# Patient Record
Sex: Male | Born: 1990 | Race: Black or African American | Hispanic: No | Marital: Single | Smoking: Never smoker
Health system: Southern US, Community
[De-identification: ages and names within clinical notes are randomized; demographics above are authoritative.]

## PROBLEM LIST (undated history)

## (undated) DIAGNOSIS — W3400XA Accidental discharge from unspecified firearms or gun, initial encounter: Secondary | ICD-10-CM

## (undated) HISTORY — DX: Accidental discharge from unspecified firearms or gun, initial encounter: W34.00XA

---
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
## 2010-02-19 ENCOUNTER — Emergency Department (HOSPITAL_COMMUNITY): Admission: EM | Admit: 2010-02-19 | Discharge: 2010-02-19 | Payer: Self-pay | Admitting: Emergency Medicine

## 2011-07-04 ENCOUNTER — Emergency Department (HOSPITAL_COMMUNITY): Admission: EM | Admit: 2011-07-04 | Payer: Self-pay | Source: Home / Self Care

## 2011-07-04 ENCOUNTER — Emergency Department (HOSPITAL_COMMUNITY)
Admission: EM | Admit: 2011-07-04 | Discharge: 2011-07-05 | Disposition: A | Discharge status: Home / Self Care | Payer: Self-pay | Attending: Emergency Medicine | Admitting: Emergency Medicine

## 2011-07-04 DIAGNOSIS — K047 Periapical abscess without sinus: Secondary | ICD-10-CM | POA: Insufficient documentation

## 2011-07-04 DIAGNOSIS — K089 Disorder of teeth and supporting structures, unspecified: Secondary | ICD-10-CM | POA: Insufficient documentation

## 2012-06-16 ENCOUNTER — Emergency Department (HOSPITAL_COMMUNITY)
Admission: EM | Admit: 2012-06-16 | Discharge: 2012-06-16 | Disposition: A | Discharge status: Home / Self Care | Payer: Self-pay | Attending: Emergency Medicine | Admitting: Emergency Medicine

## 2012-06-16 ENCOUNTER — Encounter (HOSPITAL_COMMUNITY): Payer: Self-pay | Admitting: Emergency Medicine

## 2012-06-16 DIAGNOSIS — K089 Disorder of teeth and supporting structures, unspecified: Secondary | ICD-10-CM | POA: Insufficient documentation

## 2012-06-16 DIAGNOSIS — R220 Localized swelling, mass and lump, head: Secondary | ICD-10-CM | POA: Insufficient documentation

## 2012-06-16 DIAGNOSIS — R221 Localized swelling, mass and lump, neck: Secondary | ICD-10-CM | POA: Insufficient documentation

## 2012-06-16 DIAGNOSIS — K0889 Other specified disorders of teeth and supporting structures: Secondary | ICD-10-CM

## 2012-06-16 MED ORDER — IBUPROFEN 800 MG PO TABS
800.0000 mg | ORAL_TABLET | Freq: Once | ORAL | Status: AC
  Administered 2012-06-16: 800 mg via ORAL
  Filled 2012-06-16: qty 1

## 2012-06-16 MED ORDER — PENICILLIN V POTASSIUM 500 MG PO TABS: 500.0000 mg | ORAL_TABLET | Freq: Four times a day (QID) | ORAL | Status: AC

## 2012-06-16 MED ORDER — IBUPROFEN 800 MG PO TABS: 800.0000 mg | ORAL_TABLET | Freq: Three times a day (TID) | ORAL | Status: AC

## 2012-06-16 MED ORDER — OXYCODONE-ACETAMINOPHEN 5-325 MG PO TABS
2.0000 | ORAL_TABLET | Freq: Once | ORAL | Status: AC
  Administered 2012-06-16: 2 via ORAL
  Filled 2012-06-16: qty 2

## 2012-06-16 MED ORDER — OXYCODONE-ACETAMINOPHEN 5-325 MG PO TABS: 2.0000 | ORAL_TABLET | ORAL | Status: AC | PRN

## 2012-06-16 NOTE — ED Notes (Signed)
Pt reports L lower tooth pain since last night

## 2012-06-16 NOTE — ED Provider Notes (Signed)
History     CSN: [id number]  Arrival date & time 06/16/12  0049   None     Chief Complaint  Patient presents with  . Dental Pain    (Consider location/radiation/quality/duration/timing/severity/associated sxs/prior treatment) Patient is a 21 y.o. male presenting with tooth pain. The history is provided by the patient. No language interpreter was used.  Dental PainThe primary symptoms include mouth pain. Primary symptoms do not include dental injury, oral bleeding, fever or sore throat. The symptoms began 6 to 12 hours ago. The symptoms are worsening. The symptoms occur constantly.  Additional symptoms include: gum swelling and gum tenderness. Additional symptoms do not include: purulent gums, trismus, jaw pain, facial swelling, trouble swallowing and ear pain. Medical issues do not include: smoking and periodontal disease.  21 yo with L lower incisor pain x 12 hours.   History reviewed. No pertinent past medical history.  History reviewed. No pertinent past surgical history.  History reviewed. No pertinent family history.  History  Substance Use Topics  . Smoking status: Never Smoker   . Smokeless tobacco: Not on file  . Alcohol Use: No      Review of Systems  Constitutional: Negative.  Negative for fever.  HENT: Positive for dental problem. Negative for ear pain, sore throat, facial swelling and trouble swallowing.   Eyes: Negative.   Respiratory: Negative.   Cardiovascular: Negative.   Gastrointestinal: Negative.   Neurological: Negative.   Psychiatric/Behavioral: Negative.   All other systems reviewed and are negative.    Allergies  Review of patient's allergies indicates no known allergies.  Home Medications  No current outpatient prescriptions on file.  BP 153/99  Pulse 106  Temp 98.3 F (36.8 C) (Oral)  Resp 16  SpO2 99%  Physical Exam  Nursing note and vitals reviewed. Constitutional: He is oriented to person, place, and time. He appears  well-developed and well-nourished.  HENT:  Head: Normocephalic.  Right Ear: Tympanic membrane normal.  Left Ear: Tympanic membrane normal.  Nose: Nose normal. Right sinus exhibits no maxillary sinus tenderness and no frontal sinus tenderness. Left sinus exhibits no maxillary sinus tenderness and no frontal sinus tenderness.  Mouth/Throat: Uvula is midline and oropharynx is clear and moist. No uvula swelling or dental caries. No oropharyngeal exudate, posterior oropharyngeal edema, posterior oropharyngeal erythema or tonsillar abscesses.         Gum swelling inferior L lower mouth near incisor.  No cavity visible.    Eyes: Conjunctivae and EOM are normal. Pupils are equal, round, and reactive to light.  Neck: Normal range of motion. Neck supple.  Cardiovascular: Normal rate.   Pulmonary/Chest: Effort normal.  Abdominal: Soft.  Musculoskeletal: Normal range of motion.  Neurological: He is alert and oriented to person, place, and time.  Skin: Skin is warm and dry.  Psychiatric: He has a normal mood and affect.    ED Course  Procedures (including critical care time)  Labs Reviewed - No data to display No results found.   No diagnosis found.    MDM  L lower gum swelling with no visible cavity.  rx for pcn and pain meds.  Will follow up with dentist on call if not better within 48 hours.          Remi Haggard, NP 06/17/12 [phone]

## 2012-06-17 NOTE — ED Provider Notes (Signed)
Medical screening examination/treatment/procedure(s) were performed by non-physician practitioner and as supervising physician I was immediately available for consultation/collaboration.  Byanka Landrus, MD 06/17/12 1657 

## 2013-04-06 ENCOUNTER — Emergency Department (HOSPITAL_COMMUNITY): Payer: Self-pay

## 2013-04-06 ENCOUNTER — Emergency Department (HOSPITAL_COMMUNITY)
Admission: EM | Admit: 2013-04-06 | Discharge: 2013-04-06 | Disposition: A | Discharge status: Home / Self Care | Payer: Self-pay | Attending: Emergency Medicine | Admitting: Emergency Medicine

## 2013-04-06 ENCOUNTER — Encounter (HOSPITAL_COMMUNITY): Payer: Self-pay | Admitting: Emergency Medicine

## 2013-04-06 DIAGNOSIS — S60219A Contusion of unspecified wrist, initial encounter: Secondary | ICD-10-CM | POA: Insufficient documentation

## 2013-04-06 DIAGNOSIS — S31809A Unspecified open wound of unspecified buttock, initial encounter: Secondary | ICD-10-CM | POA: Insufficient documentation

## 2013-04-06 DIAGNOSIS — Z23 Encounter for immunization: Secondary | ICD-10-CM | POA: Insufficient documentation

## 2013-04-06 DIAGNOSIS — S60212A Contusion of left wrist, initial encounter: Secondary | ICD-10-CM

## 2013-04-06 LAB — COMPREHENSIVE METABOLIC PANEL
ALT: 18 U/L (ref 0–53)
AST: 32 U/L (ref 0–37)
Alkaline Phosphatase: 87 U/L (ref 39–117)
CO2: 22 mEq/L (ref 19–32)
Calcium: 9.2 mg/dL (ref 8.4–10.5)
Chloride: 103 mEq/L (ref 96–112)
GFR calc Af Amer: >90 mL/min (ref >90)
GFR calc non Af Amer: >90 mL/min (ref >90)
Glucose, Bld: 105 mg/dL — ABNORMAL HIGH (ref 70–99)
Potassium: 3.6 mEq/L (ref 3.5–5.1)
Sodium: 139 mEq/L (ref 135–145)

## 2013-04-06 LAB — CBC WITH DIFFERENTIAL/PLATELET
Basophils Relative: 0 % (ref 0–1)
Eosinophils Absolute: 0.3 10*3/uL (ref 0.0–0.7)
Eosinophils Relative: 2 % (ref 0–5)
HCT: 44.4 % (ref 39.0–52.0)
Hemoglobin: 15.4 g/dL (ref 13.0–17.0)
Lymphocytes Relative: 36 % (ref 12–46)
MCHC: 34.7 g/dL (ref 30.0–36.0)
Monocytes Relative: 10 % (ref 3–12)
Neutro Abs: 7.8 10*3/uL — ABNORMAL HIGH (ref 1.7–7.7)
Neutrophils Relative %: 52 % (ref 43–77)
RBC: 5.37 MIL/uL (ref 4.22–5.81)
WBC: 15 10*3/uL — ABNORMAL HIGH (ref 4.0–10.5)

## 2013-04-06 MED ORDER — IBUPROFEN 400 MG PO TABS: 400.0000 mg | ORAL_TABLET | Freq: Four times a day (QID) | ORAL | Status: DC | PRN

## 2013-04-06 MED ORDER — FENTANYL CITRATE 0.05 MG/ML IJ SOLN
100.0000 ug | Freq: Once | INTRAMUSCULAR | Status: AC
  Administered 2013-04-06: 100 ug via INTRAVENOUS

## 2013-04-06 MED ORDER — SODIUM CHLORIDE 0.9 % IV BOLUS (SEPSIS): 1000.0000 mL | Freq: Once | INTRAVENOUS | Status: DC

## 2013-04-06 MED ORDER — FENTANYL CITRATE 0.05 MG/ML IJ SOLN
100.0000 ug | Freq: Once | INTRAMUSCULAR | Status: AC
  Administered 2013-04-06: 100 ug via INTRAVENOUS
  Filled 2013-04-06: qty 2

## 2013-04-06 MED ORDER — TETANUS-DIPHTH-ACELL PERTUSSIS 5-2.5-18.5 LF-MCG/0.5 IM SUSP
0.5000 mL | Freq: Once | INTRAMUSCULAR | Status: AC
  Administered 2013-04-06: 0.5 mL via INTRAMUSCULAR
  Filled 2013-04-06: qty 0.5

## 2013-04-06 MED ORDER — OXYCODONE-ACETAMINOPHEN 5-325 MG PO TABS: 2.0000 | ORAL_TABLET | ORAL | Status: DC | PRN

## 2013-04-06 MED ORDER — FENTANYL CITRATE 0.05 MG/ML IJ SOLN
100.0000 ug | Freq: Once | INTRAMUSCULAR | Status: DC
  Filled 2013-04-06: qty 2

## 2013-04-06 MED ORDER — SODIUM CHLORIDE 0.9 % IV BOLUS (SEPSIS)
1000.0000 mL | Freq: Once | INTRAVENOUS | Status: AC
  Administered 2013-04-06: 1000 mL via INTRAVENOUS
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
## 2013-04-07 ENCOUNTER — Telehealth (HOSPITAL_COMMUNITY): Payer: Self-pay | Admitting: Emergency Medicine

## 2013-04-07 NOTE — Telephone Encounter (Signed)
This encounter opened in the wrong chart. Patient's name was Theresa.

## 2013-04-16 ENCOUNTER — Encounter (INDEPENDENT_AMBULATORY_CARE_PROVIDER_SITE_OTHER): Payer: Self-pay

## 2013-04-20 ENCOUNTER — Telehealth (INDEPENDENT_AMBULATORY_CARE_PROVIDER_SITE_OTHER): Payer: Self-pay | Admitting: General Surgery

## 2013-04-20 ENCOUNTER — Telehealth (HOSPITAL_COMMUNITY): Payer: Self-pay | Admitting: Emergency Medicine

## 2013-04-20 NOTE — Telephone Encounter (Signed)
Wrong patient, addressed in correct pt chart
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
## 2013-04-23 ENCOUNTER — Encounter (INDEPENDENT_AMBULATORY_CARE_PROVIDER_SITE_OTHER): Payer: Self-pay

## 2013-04-23 ENCOUNTER — Ambulatory Visit (INDEPENDENT_AMBULATORY_CARE_PROVIDER_SITE_OTHER): Payer: Self-pay | Admitting: Internal Medicine

## 2013-04-23 DIAGNOSIS — S31809A Unspecified open wound of unspecified buttock, initial encounter: Secondary | ICD-10-CM

## 2013-04-23 DIAGNOSIS — Z5189 Encounter for other specified aftercare: Secondary | ICD-10-CM
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
## 2013-04-23 NOTE — Patient Instructions (Signed)
Neosporin and dry dressing over top wound  Change twice daily May shower Follow up next week for wound check Call with questions or concerns.

## 2013-04-30 ENCOUNTER — Telehealth (INDEPENDENT_AMBULATORY_CARE_PROVIDER_SITE_OTHER): Payer: Self-pay | Admitting: General Surgery

## 2013-04-30 ENCOUNTER — Encounter (INDEPENDENT_AMBULATORY_CARE_PROVIDER_SITE_OTHER): Payer: Self-pay

## 2013-04-30 NOTE — Telephone Encounter (Signed)
Called patient for reason of missed appointment. Recording says: "The customer you are trying to reach is unavailable."

## 2013-08-28 ENCOUNTER — Encounter (HOSPITAL_COMMUNITY): Payer: Self-pay | Admitting: Emergency Medicine

## 2013-08-28 ENCOUNTER — Emergency Department (HOSPITAL_COMMUNITY)
Admission: EM | Admit: 2013-08-28 | Discharge: 2013-08-28 | Disposition: A | Discharge status: Home / Self Care | Payer: Self-pay | Attending: Emergency Medicine | Admitting: Emergency Medicine

## 2013-08-28 DIAGNOSIS — Z872 Personal history of diseases of the skin and subcutaneous tissue: Secondary | ICD-10-CM | POA: Insufficient documentation

## 2013-08-28 DIAGNOSIS — R509 Fever, unspecified: Secondary | ICD-10-CM | POA: Insufficient documentation

## 2013-08-28 DIAGNOSIS — L0501 Pilonidal cyst with abscess: Secondary | ICD-10-CM | POA: Insufficient documentation

## 2013-08-28 MED ORDER — LIDOCAINE HCL 2 % IJ SOLN
10.0000 mL | Freq: Once | INTRAMUSCULAR | Status: AC
  Administered 2013-08-28: 200 mg
  Filled 2013-08-28: qty 20

## 2013-08-28 MED ORDER — HYDROCODONE-ACETAMINOPHEN 5-325 MG PO TABS: 1.0000 | ORAL_TABLET | ORAL | Status: AC | PRN

## 2013-08-28 MED ORDER — SULFAMETHOXAZOLE-TRIMETHOPRIM 800-160 MG PO TABS: 1.0000 | ORAL_TABLET | Freq: Two times a day (BID) | ORAL | Status: AC

## 2013-08-28 MED ORDER — HYDROCODONE-ACETAMINOPHEN 5-325 MG PO TABS
1.0000 | ORAL_TABLET | Freq: Once | ORAL | Status: AC
  Administered 2013-08-28: 1 via ORAL
  Filled 2013-08-28: qty 1

## 2013-08-28 NOTE — ED Provider Notes (Signed)
Medical screening examination/treatment/procedure(s) were performed by non-physician practitioner and as supervising physician I was immediately available for consultation/collaboration.   Rolan Bucco, MD 08/28/13 [phone]
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
## 2013-08-30 ENCOUNTER — Emergency Department (HOSPITAL_COMMUNITY)
Admission: EM | Admit: 2013-08-30 | Discharge: 2013-08-30 | Disposition: A | Discharge status: Home / Self Care | Payer: Self-pay | Attending: Emergency Medicine | Admitting: Emergency Medicine

## 2013-08-30 DIAGNOSIS — L0501 Pilonidal cyst with abscess: Secondary | ICD-10-CM | POA: Insufficient documentation

## 2013-08-30 DIAGNOSIS — Z4801 Encounter for change or removal of surgical wound dressing: Secondary | ICD-10-CM | POA: Insufficient documentation
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
## 2013-08-31 NOTE — ED Provider Notes (Signed)
Medical screening examination/treatment/procedure(s) were performed by non-physician practitioner and as supervising physician I was immediately available for consultation/collaboration.  Sherhonda Gaspar F Cyle Kenyon, MD 08/31/13 0851 
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
## 2015-08-30 ENCOUNTER — Encounter (HOSPITAL_COMMUNITY): Payer: Self-pay | Admitting: *Deleted

## 2015-08-30 ENCOUNTER — Emergency Department (HOSPITAL_COMMUNITY): Payer: No Typology Code available for payment source

## 2015-08-30 ENCOUNTER — Emergency Department (HOSPITAL_COMMUNITY)
Admission: EM | Admit: 2015-08-30 | Discharge: 2015-08-30 | Disposition: A | Discharge status: Home / Self Care | Payer: No Typology Code available for payment source | Attending: Emergency Medicine | Admitting: Emergency Medicine

## 2015-08-30 DIAGNOSIS — Y9389 Activity, other specified: Secondary | ICD-10-CM | POA: Insufficient documentation

## 2015-08-30 DIAGNOSIS — Y998 Other external cause status: Secondary | ICD-10-CM | POA: Diagnosis not present

## 2015-08-30 DIAGNOSIS — Y92410 Unspecified street and highway as the place of occurrence of the external cause: Secondary | ICD-10-CM | POA: Insufficient documentation

## 2015-08-30 DIAGNOSIS — S99912A Unspecified injury of left ankle, initial encounter: Secondary | ICD-10-CM | POA: Diagnosis present

## 2015-08-30 DIAGNOSIS — S93402A Sprain of unspecified ligament of left ankle, initial encounter: Secondary | ICD-10-CM | POA: Insufficient documentation

## 2015-08-30 DIAGNOSIS — Z79899 Other long term (current) drug therapy: Secondary | ICD-10-CM | POA: Insufficient documentation

## 2015-08-30 MED ORDER — NAPROXEN 500 MG PO TABS: 500.0000 mg | ORAL_TABLET | Freq: Two times a day (BID) | ORAL | Status: AC
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
## 2015-09-11 ENCOUNTER — Encounter (HOSPITAL_COMMUNITY): Payer: Self-pay | Admitting: Emergency Medicine

## 2015-09-11 ENCOUNTER — Emergency Department (HOSPITAL_COMMUNITY)
Admission: EM | Admit: 2015-09-11 | Discharge: 2015-09-11 | Disposition: A | Discharge status: Home / Self Care | Payer: No Typology Code available for payment source | Attending: Emergency Medicine | Admitting: Emergency Medicine

## 2015-09-11 DIAGNOSIS — Z791 Long term (current) use of non-steroidal anti-inflammatories (NSAID): Secondary | ICD-10-CM | POA: Diagnosis not present

## 2015-09-11 DIAGNOSIS — Y998 Other external cause status: Secondary | ICD-10-CM | POA: Insufficient documentation

## 2015-09-11 DIAGNOSIS — S93402A Sprain of unspecified ligament of left ankle, initial encounter: Secondary | ICD-10-CM

## 2015-09-11 DIAGNOSIS — S99912A Unspecified injury of left ankle, initial encounter: Secondary | ICD-10-CM | POA: Diagnosis present

## 2015-09-11 DIAGNOSIS — Y9389 Activity, other specified: Secondary | ICD-10-CM | POA: Insufficient documentation

## 2015-09-11 DIAGNOSIS — Y92410 Unspecified street and highway as the place of occurrence of the external cause: Secondary | ICD-10-CM | POA: Diagnosis not present

## 2015-09-11 MED ORDER — TRAMADOL HCL 50 MG PO TABS: 50.0000 mg | ORAL_TABLET | Freq: Four times a day (QID) | ORAL | Status: AC | PRN

## 2015-09-11 NOTE — ED Notes (Signed)
Patient was alert, oriented and stable upon discharge. RN went over AVS and patient had no further questions.  

## 2015-09-11 NOTE — Discharge Instructions (Signed)
Ankle Sprain °An ankle sprain is an injury to the strong, fibrous tissues (ligaments) that hold the bones of your ankle joint together.  °CAUSES °An ankle sprain is usually caused by a fall or by twisting your ankle. Ankle sprains most commonly occur when you step on the outer edge of your foot, and your ankle turns inward. People who participate in sports are more prone to these types of injuries.  °SYMPTOMS  °· Pain in your ankle. The pain may be present at rest or only when you are trying to stand or walk. °· Swelling. °· Bruising. Bruising may develop immediately or within 1 to 2 days after your injury. °· Difficulty standing or walking, particularly when turning corners or changing directions. °DIAGNOSIS  °Your caregiver will ask you details about your injury and perform a physical exam of your ankle to determine if you have an ankle sprain. During the physical exam, your caregiver will press on and apply pressure to specific areas of your foot and ankle. Your caregiver will try to move your ankle in certain ways. An X-ray exam may be done to be sure a bone was not broken or a ligament did not separate from one of the bones in your ankle (avulsion fracture).  °TREATMENT  °Certain types of braces can help stabilize your ankle. Your caregiver can make a recommendation for this. Your caregiver may recommend the use of medicine for pain. If your sprain is severe, your caregiver may refer you to a surgeon who helps to restore function to parts of your skeletal system (orthopedist) or a physical therapist. °HOME CARE INSTRUCTIONS  °· Apply ice to your injury for 1-2 days or as directed by your caregiver. Applying ice helps to reduce inflammation and pain. °¨ Put ice in a plastic bag. °¨ Place a towel between your skin and the bag. °¨ Leave the ice on for 15-20 minutes at a time, every 2 hours while you are awake. °· Only take over-the-counter or prescription medicines for pain, discomfort, or fever as directed by  your caregiver. °· Elevate your injured ankle above the level of your heart as much as possible for 2-3 days. °· If your caregiver recommends crutches, use them as instructed. Gradually put weight on the affected ankle. Continue to use crutches or a cane until you can walk without feeling pain in your ankle. °· If you have a plaster splint, wear the splint as directed by your caregiver. Do not rest it on anything harder than a pillow for the first 24 hours. Do not put weight on it. Do not get it wet. You may take it off to take a shower or bath. °· You may have been given an elastic bandage to wear around your ankle to provide support. If the elastic bandage is too tight (you have numbness or tingling in your foot or your foot becomes cold and blue), adjust the bandage to make it comfortable. °· If you have an air splint, you may blow more air into it or let air out to make it more comfortable. You may take your splint off at night and before taking a shower or bath. Wiggle your toes in the splint several times per day to decrease swelling. °SEEK MEDICAL CARE IF:  °· You have rapidly increasing bruising or swelling. °· Your toes feel extremely cold or you lose feeling in your foot. °· Your pain is not relieved with medicine. °SEEK IMMEDIATE MEDICAL CARE IF: °· Your toes are numb or blue. °·   You have severe pain that is increasing. MAKE SURE YOU:   Understand these instructions.  Will watch your condition.  Will get help right away if you are not doing well or get worse.   This information is not intended to replace advice given to you by your health care provider. Make sure you discuss any questions you have with your health care provider.   Document Released: 10/28/2005 Document Revised: 11/18/2014 Document Reviewed: 11/09/2011 Elsevier Interactive Patient Education 2016 [employer]  Emergency Department Resource Guide 1) Find a Doctor and Pay Out of Pocket Although you won't have to find out who  is covered by your insurance plan, it is a good idea to ask around and get recommendations. You will then need to call the office and see if the doctor you have chosen will accept you as a new patient and what types of options they offer for patients who are self-pay. Some doctors offer discounts or will set up payment plans for their patients who do not have insurance, but you will need to ask so you aren't surprised when you get to your appointment.  2) Contact Your Local Health Department Not all health departments have doctors that can see patients for sick visits, but many do, so it is worth a call to see if yours does. If you don't know where your local health department is, you can check in your phone book. The CDC also has a tool to help you locate your state's health department, and many state websites also have listings of all of their local health departments.  3) Find a Walk-in Clinic If your illness is not likely to be very severe or complicated, you may want to try a walk in clinic. These are popping up all over the country in pharmacies, drugstores, and shopping centers. They're usually staffed by nurse practitioners or physician assistants that have been trained to treat common illnesses and complaints. They're usually fairly quick and inexpensive. However, if you have serious medical issues or chronic medical problems, these are probably not your best option.  No Primary Care Doctor: - Call Health Connect at  [phone] - they can help you locate a primary care doctor that  accepts your insurance, provides certain services, etc. - Physician Referral Service- [phone]  Chronic Pain Problems: Organization         Address  Phone   Notes  [facility]  [phone]) [phone] Patients need to be referred by their primary care doctor.   Medication Assistance: Organization         Address  Phone   Notes  [facility] [address]  [address] [place], [place] [address] [phone] --Must be a resident of [place] [facility] -- Must have NO insurance coverage whatsoever (no Medicaid/ Medicare, etc.) -- The pt. MUST have a primary care doctor that directs their care regularly and follows them in the community   MedAssist  [phone]   [employer]  [phone]    Agencies that provide inexpensive medical care: Organization         Address  Phone   Notes  [facility]  [phone]) [phone]   [facility]    [phone]   [facility] [address] [place], [place] [address] [phone]   [facility] [address], [place] [phone]   [facility]    [phone]) [phone]  [facility]    [phone]   [facility]  [address], [place] Phone:  [phone], Fax:  [phone] Hours of Operation:  9 am - 6 pm, M-F.  Also accepts Medicaid/Medicare and self-pay.  [facility]  [address], [place] Phone: [phone], Fax: [phone]. Hours of Operation:  8:30 am - 5:30 pm, M-F.  Also accepts Medicaid and self-pay.  [facility] [place] [address], [place] Phone: [phone]   [facility] [address] Natasha [place]ence [place], [place] [phone], Ext. [phone] Mondays & Thursdays: 7-9 AM.  First 15 patients are seen on a first come, first serve basis.    Medicaid-accepting [facility] Providers:  Organization         Address  Phone   Notes  [facility] [address],  [phone] Also accepts self-pay patients.  [facility] [address] [place], [place]  [phone]   [facility] [address], [place] [phone]   [facility] [address] [place] [phone]   Renaye Rakers [address], [place]   [phone] Only accepts [place] patients after they have their name applied to their card.   Self-Pay (no insurance) in [facility]:  Organization         Address  Phone   Notes  Sickle Cell Patients, [facility] [address] [place], [place] [phone]   [facility] [address] [place], [place] [phone]   [facility] [place]  [address], [place] [phone]   [facility]/Dr. Osei-Bonsu  [address], [place] or [address], [place] [phone] Phone number for both [place] and [place] locations is the same.  [facility]gent [facility] and [facility] [address], [place] [phone]   [facility] [address], [place] or [address] [phone]   [facility] [address] [place] [phone], phone; [phone], fax Sees patients 1st and 3rd Saturday of every month.  Must not qualify for public or private insurance (i.e. Medicaid, Medicare, [place] Health Choice, Veterans' Benefits)  Household income should be no more than 200% of the poverty level The clinic cannot treat you if you are pregnant or think you are pregnant  Sexually transmitted diseases are not treated at the clinic.    Dental Care: Organization         Address  Phone  Notes  [facility] Department of [facility] [address] [place], [place] [phone] Accepts children up to age 21 who are enrolled in [place] or [place] Health Choice; pregnant women with a Medicaid card; and children who have applied for Medicaid or [place] Health Choice, but were declined, whose parents can pay a reduced fee at time of service.  [facility] Department of [facility]  [address], [place] [phone] Accepts children up to age 69 who are  enrolled in [place] or [place] Health Choice; pregnant women with a Medicaid card; and children who have applied for Medicaid or  Health Choice, but were declined, whose parents can pay a reduced fee at time  of service.  [facility]  [address] [place], [place] [phone] Patients are seen by appointment only. Walk-ins are not accepted. [facility] will see patients 47 years of age and older. Monday - Tuesday (8am-5pm) Most Wednesdays (8:30-5pm) $30 per visit, cash only  [facility] Adult Dental Access PROGRAM  [address], [facility] [phone] Patients are seen by appointment only. Walk-ins are not accepted. [facility] will see patients 28 years of age and older. One Wednesday Evening (Monthly: Volunteer Based).  $30 per visit, cash only  [employer]  [phone] for adults; Children under age 62, call Graduate Pediatric Dentistry at [phone]. Children aged 42-14, please call [phone] to request a pediatric application.  Dental services are provided in all areas of dental care including fillings, crowns and bridges, complete and partial dentures, implants, gum treatment, root canals, and extractions. Preventive care is also provided. Treatment is provided to both adults and children. Patients are selected via a lottery and there is often a waiting list.   [facility] [address] [place]  [phone] [email or web address]   [facility] [address] [place], [place] [phone], Ext. [phone] Second and Fourth Thursday of each month, opens at 6:30 AM; Clinic ends at 9 AM.  Patients are seen on a first-come first-served basis, and a limited number are seen during each clinic.   [facility]  [address] Ether Griffins [place], [place] [phone]   Eligibility Requirements You must have lived in [place], [place], or [place] counties for at least the last three months.   You  cannot be eligible for state or federal sponsored [place], including [employer], [place], or [employer].   You generally cannot be eligible for healthcare insurance through your employer.    How to apply: Eligibility screenings are held every Tuesday and Wednesday afternoon from 1:00 pm until 4:00 pm. You do not need an appointment for the interview!  [facility] [address] [place], [place] [phone]   [facility] Health Department  [phone]   [facility] Health Department  [phone]   [facility] Health Department  [phone]    Behavioral Health Resources in the Community: Intensive Outpatient Programs Organization         Address  Phone  Notes  [facility] [address] [place], [place] [phone]   [facility] [address] [place], [place] [phone]   [facility]: [facility] [address], [place], [place]  [phone]   [facility] Mental Health [address]  [place], [place] [phone] or [phone]   Substance Abuse Resources Organization         Address  Phone  Notes  [facility] Drug Services  [phone]   [facility]  [phone]   [facility]  [phone]   Floydene Flock  [phone]   Residential & Outpatient Substance Abuse Program  [phone]   Psychological Services Organization         Address  Phone  Notes  [facility] Behavioral Health  [phone]   [facility]  [phone]   [facility] Mental Health [address], [place] [phone] or [phone]    Mobile Crisis Teams Organization         Address  Phone  Notes  [facility], Mobile Crisis Care Unit  [phone]   [facility]  [address] [place], [place] [phone]   [facility] [address] [place] [phone]    Self-Help/Support  Groups Organization         Address  Phone             Notes  [facility] [place] - variety of support groups  336- [phone] Call for more information  Narcotics Anonymous (NA), [facility]aring [facility] [address], [place]  2 meetings at this location   Statisticianesidential Treatment Programs Organization         Address  Phone  Notes  [facility] [address],    [place]  [phone]   [facility]  [address], [place] [phone], [place], [place] [phone]   [facility] [address], [place] [phone] Admissions: 8am-3pm M-F  [facility] [address]    [place], [place] [phone]   [facility] [address], [place], [place] [phone]   [facility] [address]  [place], [place] [phone]   [facility] - Intensive Outpatient [address], [place], [place] [phone]   [facility] ([facility] [address]  [place], [place] [phone] or [phone]   [facility] ([facility]) [address] [place], [place] [phone] Accepts Medicaid  [facility] [address]  [place] [phone] Substance Abuse/Addiction Treatment   [facility] Behavioral Health Resources Organization         Address  Phone  Notes  [facility]  [phone]) [phone]   Angie FavaJulie Brannon, PhD [address] [place], [place]   [phone]) [phone] or [phone]) [phone]   [facility]   [address] [place], [place] [phone](336) [phone]   [facility] [address], [place], [place] [phone](336) [phone] Insurance/Medicaid/sponsorship through [facility]  [facility] [address]                                    [place], [place] [phone]) [phone] Therapy/tele-psych/case  [facility] [address] [place].   [place], [place] [phone](336) [phone]    Dr. Lolly MustacheArfeen  [phone](336) [phone]   [facility]  [employer] [facility] 1) [address], [place] 2) [address], [place] 3)  [address], [place] [phone]) [phone](336) [phone]   [facility] Child Abuse Hotline [phone](336) [phone] or [phone]) [phone] (After Hours)
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
